# Patient Record
Sex: Female | Born: 1990 | Race: Black or African American | Hispanic: No | Marital: Single | Smoking: Never smoker
Health system: Southern US, Community
[De-identification: ages and names within clinical notes are randomized; demographics above are authoritative.]

## PROBLEM LIST (undated history)

## (undated) DIAGNOSIS — T7840XA Allergy, unspecified, initial encounter: Secondary | ICD-10-CM

## (undated) HISTORY — PX: OTHER SURGICAL HISTORY: SHX169

## (undated) HISTORY — DX: Allergy, unspecified, initial encounter: T78.40XA

---
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
## 2012-05-29 ENCOUNTER — Encounter: Payer: Self-pay | Admitting: Family Medicine

## 2012-05-29 ENCOUNTER — Ambulatory Visit (INDEPENDENT_AMBULATORY_CARE_PROVIDER_SITE_OTHER): Payer: Federal, State, Local not specified - PPO | Admitting: Family Medicine

## 2012-05-29 VITALS — BP 110/70 | HR 96 | Temp 98.3°F | Wt 120.4 lb

## 2012-05-29 DIAGNOSIS — J329 Chronic sinusitis, unspecified: Secondary | ICD-10-CM

## 2012-05-29 MED ORDER — AMOXICILLIN-POT CLAVULANATE 875-125 MG PO TABS: 1.0000 | ORAL_TABLET | Freq: Two times a day (BID) | ORAL | Status: DC

## 2012-05-29 NOTE — Progress Notes (Signed)
  Subjective:     Yolanda Tucker is a 21 y.o. female who presents for evaluation of sinus pain. Symptoms include: congestion, cough, facial pain, headaches, nasal congestion, post nasal drip and sinus pressure. Onset of symptoms was 4 weeks ago. Symptoms have been gradually worsening since that time. Past history is significant for no history of pneumonia or bronchitis. Patient is a non-smoker.  The following portions of the patient's history were reviewed and updated as appropriate: allergies, current medications, past family history, past medical history, past social history, past surgical history and problem list.  Review of Systems Pertinent items are noted in HPI.   Objective:    BP 110/70  Pulse 96  Temp 98.3 F (36.8 C) (Oral)  Wt 120 lb 6.4 oz (54.613 kg)  SpO2 98% General appearance: alert, cooperative, appears stated age and mild distress Nose: green discharge, moderate congestion, turbinates red, swollen, edematous, sinus tenderness bilateral Throat: abnormal findings: mild oropharyngeal erythema Neck: mild anterior cervical adenopathy, supple, symmetrical, trachea midline and thyroid not enlarged, symmetric, no tenderness/mass/nodules Lungs: clear to auscultation bilaterally Heart: S1, S2 normal    Assessment:    Acute bacterial sinusitis.    Plan:    Nasal steroids per medication orders. Antihistamines per medication orders. Augmentin per medication orders. Follow up in a few days or as needed.

## 2012-05-29 NOTE — Patient Instructions (Addendum)

## 2012-06-10 ENCOUNTER — Telehealth: Payer: Self-pay | Admitting: Family Medicine

## 2012-06-10 NOTE — Telephone Encounter (Signed)
Caller: [name]/Patient; Patient Name: Yolanda Tucker; PCP: Lelon Perla.; Best Callback Phone Number: [phone]. Onset 05/29/12 Patient reports she was seen in the office 2 weeks ago and diagnosed sinus infection  and has completed antibiotic and feeling no better.  Afebrile.  Cough, congestion, runny nose clear to yellow in color.  All emergent symptoms ruled out per Upper Respiratory Infection protocol with exception "Symptoms worsen after 7 days or symptoms do not improve after 14 days of home care."  Home care advice given. Per disposition see provider within 24 hours, patient states she will need to call in the AM to schedule appt. due to her school schedule.

## 2012-06-11 NOTE — Telephone Encounter (Signed)
Called patient to schedule appointment. Message on phone indicated patient is not accepting calls at this time. I will try to reach patient again later

## 2012-06-11 NOTE — Telephone Encounter (Signed)
I called patient again and recording indicated patient is not accepting calls at this time.   I called patient on alternate number and left message informing patient to call for appointment, I made her aware on VM that Dr.Lowne is out of the office but she can schedule with one of the other MD's

## 2012-06-12 ENCOUNTER — Ambulatory Visit (INDEPENDENT_AMBULATORY_CARE_PROVIDER_SITE_OTHER): Payer: 59 | Admitting: Family Medicine

## 2012-06-12 ENCOUNTER — Encounter: Payer: Self-pay | Admitting: Family Medicine

## 2012-06-12 VITALS — BP 114/71 | HR 100 | Temp 97.8°F | Ht 65.0 in | Wt 119.2 lb

## 2012-06-12 DIAGNOSIS — T7840XA Allergy, unspecified, initial encounter: Secondary | ICD-10-CM

## 2012-06-12 MED ORDER — MOMETASONE FUROATE 50 MCG/ACT NA SUSP: 2.0000 | Freq: Every day | NASAL | Status: DC

## 2012-06-12 NOTE — Assessment & Plan Note (Signed)
New to provider.  No evidence of current infxn- no need for abx.  Continue daily antihistamine.  Add nasal steroid to decrease congestion and PND.  OTC decongestant short term for sxs relief.  Reviewed supportive care and red flags that should prompt return.  Pt expressed understanding and is in agreement w/ plan.

## 2012-06-12 NOTE — Progress Notes (Signed)
  Subjective:    Patient ID: Yolanda Tucker, female    DOB: Aug 01, 1991, 21 y.o.   MRN: [id number]  HPI ? Sinusitis- pt reports she has had sinus infxn x2 months.  Was tx'd w/ Augmentin after seeing Dr Laury Axon but continues to have congestion, runny nose, dry cough.  Denies facial pain/pressure.  No ear pain.  No fevers.  Taking Zyrtec daily.   Review of Systems For ROS see HPI     Objective:   Physical Exam  Vitals reviewed. Constitutional: She appears well-developed and well-nourished. No distress.  HENT:  Head: Normocephalic and atraumatic.  Right Ear: Tympanic membrane normal.  Left Ear: Tympanic membrane normal.  Nose: Mucosal edema and rhinorrhea present. Right sinus exhibits no maxillary sinus tenderness and no frontal sinus tenderness. Left sinus exhibits no maxillary sinus tenderness and no frontal sinus tenderness.  Mouth/Throat: Mucous membranes are normal. Posterior oropharyngeal erythema (w/ PND) present.  Eyes: Conjunctivae normal and EOM are normal. Pupils are equal, round, and reactive to light.  Neck: Normal range of motion. Neck supple.  Cardiovascular: Normal rate, regular rhythm and normal heart sounds.   Pulmonary/Chest: Effort normal and breath sounds normal. No respiratory distress. She has no wheezes. She has no rales.  Lymphadenopathy:    She has no cervical adenopathy.          Assessment & Plan:

## 2012-06-12 NOTE — Patient Instructions (Addendum)
This is all allergy related Continue the Zyrtec Add the nasal spray- 2 sprays each nostril daily Start Sudafed (available over the counter) for 3-5 days Drink plenty of fluids REST! Hang in there!!!

## 2012-07-15 ENCOUNTER — Encounter: Payer: Self-pay | Admitting: Family Medicine

## 2012-07-15 ENCOUNTER — Ambulatory Visit (INDEPENDENT_AMBULATORY_CARE_PROVIDER_SITE_OTHER): Payer: 59 | Admitting: Family Medicine

## 2012-07-15 VITALS — BP 102/68 | HR 87 | Temp 98.2°F | Wt 123.2 lb

## 2012-07-15 DIAGNOSIS — J329 Chronic sinusitis, unspecified: Secondary | ICD-10-CM

## 2012-07-15 DIAGNOSIS — J40 Bronchitis, not specified as acute or chronic: Secondary | ICD-10-CM

## 2012-07-15 MED ORDER — ALBUTEROL SULFATE (5 MG/ML) 0.5% IN NEBU
2.5000 mg | INHALATION_SOLUTION | Freq: Once | RESPIRATORY_TRACT | Status: AC
  Administered 2012-07-15: 2.5 mg via RESPIRATORY_TRACT

## 2012-07-15 MED ORDER — GUAIFENESIN-CODEINE 100-10 MG/5ML PO SYRP: ORAL_SOLUTION | ORAL | Status: DC

## 2012-07-15 MED ORDER — AMOXICILLIN-POT CLAVULANATE 875-125 MG PO TABS: 1.0000 | ORAL_TABLET | Freq: Two times a day (BID) | ORAL | Status: DC

## 2012-07-15 MED ORDER — PREDNISONE 10 MG PO TABS: ORAL_TABLET | ORAL | Status: DC

## 2012-07-15 NOTE — Progress Notes (Signed)
  Subjective:     Yolanda Tucker is a 21 y.o. female here for evaluation of a cough. Onset of symptoms was 1 week ago. Symptoms have been gradually worsening since that time. The cough is productive and is aggravated by cold air, exercise, infection and reclining position. Associated symptoms include: postnasal drip, shortness of breath, sputum production and wheezing. Patient does not have a history of asthma. Patient does not have a history of environmental allergens. Patient has traveled recently. Patient does not have a history of smoking. Patient has not had a previous chest x-ray. Patient has not had a PPD done.  The following portions of the patient's history were reviewed and updated as appropriate: allergies, current medications, past family history, past medical history, past social history, past surgical history and problem list.  Review of Systems Pertinent items are noted in HPI.    Objective:    Oxygen saturation 97% on room air BP 102/68  Pulse 87  Temp 98.2 F (36.8 C) (Oral)  Wt 123 lb 3.2 oz (55.883 kg)  SpO2 97% General appearance: alert, cooperative, appears stated age and no distress Eyes: conjunctivae/corneas clear. PERRL, EOM's intact. Fundi benign. Ears: normal TM's and external ear canals both ears Nose: green discharge, moderate congestion, turbinates red, swollen, sinus tenderness bilateral Throat: lips, mucosa, and tongue normal; teeth and gums normal Neck: no adenopathy, supple, symmetrical, trachea midline and thyroid not enlarged, symmetric, no tenderness/mass/nodules Back: na Lungs: diminished breath sounds bilaterally Heart: S1, S2 normal Lymph nodes: Cervical, supraclavicular, and axillary nodes normal.    Assessment:    Acute Bronchitis and Sinusitis    Plan:    Antibiotics per medication orders. Antitussives per medication orders. Avoid exposure to tobacco smoke and fumes. Call if shortness of breath worsens, blood in sputum, change in  character of cough, development of fever or chills, inability to maintain nutrition and hydration. Avoid exposure to tobacco smoke and fumes. Follow-up in 2 weeks, or sooner as needed.

## 2012-07-15 NOTE — Patient Instructions (Addendum)

## 2012-12-10 ENCOUNTER — Encounter (HOSPITAL_COMMUNITY): Payer: Self-pay | Admitting: Emergency Medicine

## 2012-12-10 DIAGNOSIS — R51 Headache: Secondary | ICD-10-CM | POA: Insufficient documentation

## 2012-12-10 DIAGNOSIS — Z79899 Other long term (current) drug therapy: Secondary | ICD-10-CM | POA: Insufficient documentation

## 2012-12-10 DIAGNOSIS — R079 Chest pain, unspecified: Secondary | ICD-10-CM | POA: Insufficient documentation

## 2012-12-10 DIAGNOSIS — Z3202 Encounter for pregnancy test, result negative: Secondary | ICD-10-CM | POA: Insufficient documentation

## 2012-12-10 DIAGNOSIS — R42 Dizziness and giddiness: Secondary | ICD-10-CM | POA: Insufficient documentation

## 2012-12-10 LAB — POCT PREGNANCY, URINE: Preg Test, Ur: NEGATIVE

## 2012-12-10 NOTE — ED Notes (Signed)
PT. REPORTS HEADACHE ONSET YESTERDAY AND MID CHEST PAIN WITH DRY COUGH AND DIZZINESS .

## 2012-12-11 ENCOUNTER — Emergency Department (HOSPITAL_COMMUNITY): Payer: Federal, State, Local not specified - PPO

## 2012-12-11 ENCOUNTER — Encounter (HOSPITAL_COMMUNITY): Payer: Self-pay | Admitting: Radiology

## 2012-12-11 ENCOUNTER — Emergency Department (HOSPITAL_COMMUNITY)
Admission: EM | Admit: 2012-12-11 | Discharge: 2012-12-11 | Disposition: A | Discharge status: Home / Self Care | Payer: Federal, State, Local not specified - PPO | Attending: Emergency Medicine | Admitting: Emergency Medicine

## 2012-12-11 DIAGNOSIS — R51 Headache: Secondary | ICD-10-CM

## 2012-12-11 LAB — COMPREHENSIVE METABOLIC PANEL WITH GFR
ALT: 13 U/L (ref 0–35)
AST: 23 U/L (ref 0–37)
Albumin: 3.8 g/dL (ref 3.5–5.2)
Alkaline Phosphatase: 49 U/L (ref 39–117)
BUN: 14 mg/dL (ref 6–23)
CO2: 28 meq/L (ref 19–32)
Calcium: 9.5 mg/dL (ref 8.4–10.5)
Chloride: 105 meq/L (ref 96–112)
Creatinine, Ser: 0.63 mg/dL (ref 0.50–1.10)
GFR calc Af Amer: >90 mL/min (ref >90)
GFR calc non Af Amer: >90 mL/min (ref >90)
Glucose, Bld: 94 mg/dL (ref 70–99)
Potassium: 3.5 meq/L (ref 3.5–5.1)
Sodium: 140 meq/L (ref 135–145)
Total Bilirubin: 0.3 mg/dL (ref 0.3–1.2)
Total Protein: 7.5 g/dL (ref 6.0–8.3)

## 2012-12-11 LAB — CBC WITH DIFFERENTIAL/PLATELET
Basophils Relative: 0 % (ref 0–1)
Eosinophils Absolute: 0.3 10*3/uL (ref 0.0–0.7)
Hemoglobin: 10.6 g/dL — ABNORMAL LOW (ref 12.0–15.0)
Lymphs Abs: 2 10*3/uL (ref 0.7–4.0)
MCH: 23.5 pg — ABNORMAL LOW (ref 26.0–34.0)
MCHC: 33.9 g/dL (ref 30.0–36.0)
Monocytes Absolute: 0.5 10*3/uL (ref 0.1–1.0)
Neutro Abs: 2.1 10*3/uL (ref 1.7–7.7)
Neutrophils Relative %: 43 % (ref 43–77)

## 2012-12-11 MED ORDER — HYDROCODONE-ACETAMINOPHEN 5-325 MG PO TABS: 1.0000 | ORAL_TABLET | ORAL | Status: DC | PRN

## 2012-12-11 MED ORDER — SODIUM CHLORIDE 0.9 % IV BOLUS (SEPSIS)
1000.0000 mL | Freq: Once | INTRAVENOUS | Status: AC
  Administered 2012-12-11: 1000 mL via INTRAVENOUS

## 2012-12-11 MED ORDER — IBUPROFEN 600 MG PO TABS: 600.0000 mg | ORAL_TABLET | Freq: Three times a day (TID) | ORAL | Status: AC | PRN

## 2012-12-11 MED ORDER — MORPHINE SULFATE 4 MG/ML IJ SOLN
4.0000 mg | Freq: Once | INTRAMUSCULAR | Status: AC
  Administered 2012-12-11: 4 mg via INTRAVENOUS
  Filled 2012-12-11: qty 1

## 2012-12-11 MED ORDER — METOCLOPRAMIDE HCL 5 MG/ML IJ SOLN
10.0000 mg | Freq: Once | INTRAMUSCULAR | Status: AC
  Administered 2012-12-11: 10 mg via INTRAVENOUS
  Filled 2012-12-11: qty 2

## 2012-12-11 NOTE — ED Notes (Signed)
Pt reports that she is currently taking an antibiotic and Prednisone for recent URI.

## 2012-12-11 NOTE — ED Provider Notes (Signed)
History     CSN: [id number]  Arrival date & time 12/10/12  2325   First MD Initiated Contact with Patient 12/11/12 0011      Chief Complaint  Patient presents with  . Headache  . Chest Pain    The history is provided by the patient.   patient reports worsening headache since yesterday.  She was at a meeting when her headache began and seems like a persistently has been getting worse.  No nausea or vomiting.  No frank photophobia.  She's never had headaches like this before.  She's tried over-the-counter pain medications without improvement in her symptoms.  No fevers or chills.  No recent trauma to her head.  No neck pain or neck stiffness.  She does report some mid chest pain with cough as well as some associated "dizziness".  No weakness of her upper lower extremities.  She ambulated in the emergency department.  She reports occasional episodes of sharp stabbing pain that makes her have bilateral blurred vision.  She reports she's currently on an antibiotic for a recent sinus infection.  She's also taking prednisone.  Past Medical History  Diagnosis Date  . Allergy     History reviewed. No pertinent past surgical history.  Family History  Problem Relation Age of Onset  . Diabetes      History  Substance Use Topics  . Smoking status: Never Smoker   . Smokeless tobacco: Never Used  . Alcohol Use: No    OB History   Grav Para Term Preterm Abortions TAB SAB Ect Mult Living                  Review of Systems  Cardiovascular: Positive for chest pain.  Neurological: Positive for headaches.  All other systems reviewed and are negative.    Allergies  Review of patient's allergies indicates no known allergies.  Home Medications   Current Outpatient Rx  Name  Route  Sig  Dispense  Refill  . cefUROXime (CEFTIN) 500 MG tablet   Oral   Take 500 mg by mouth 2 (two) times daily.         Marland Kitchen ibuprofen (ADVIL,MOTRIN) 200 MG tablet   Oral   Take 200 mg by mouth every 6  (six) hours as needed for pain.         Marland Kitchen levocetirizine (XYZAL) 5 MG tablet   Oral   Take 5 mg by mouth every evening.         Marland Kitchen HYDROcodone-acetaminophen (NORCO/VICODIN) 5-325 MG per tablet   Oral   Take 1 tablet by mouth every 4 (four) hours as needed for pain.   8 tablet   0   . ibuprofen (ADVIL,MOTRIN) 600 MG tablet   Oral   Take 1 tablet (600 mg total) by mouth every 8 (eight) hours as needed for pain.   15 tablet   0     BP 134/79  Pulse 81  Temp(Src) 98.8 F (37.1 C) (Oral)  Resp 17  SpO2 99%  LMP 12/11/2012  Physical Exam  Nursing note and vitals reviewed. Constitutional: She is oriented to person, place, and time. She appears well-developed and well-nourished. No distress.  HENT:  Head: Normocephalic and atraumatic.  Eyes: EOM are normal. Pupils are equal, round, and reactive to light.  Neck: Normal range of motion.  Cardiovascular: Normal rate, regular rhythm and normal heart sounds.   Pulmonary/Chest: Effort normal and breath sounds normal.  Abdominal: Soft. She exhibits no distension. There is  no tenderness.  Musculoskeletal: Normal range of motion.  Neurological: She is alert and oriented to person, place, and time.  5/5 strength in major muscle groups of  bilateral upper and lower extremities. Speech normal. No facial asymetry.   Skin: Skin is warm and dry.  Psychiatric: She has a normal mood and affect. Judgment normal.    ED Course  Procedures (including critical care time)   Date: 12/11/2012  Rate: 89  Rhythm: normal sinus rhythm  QRS Axis: normal  Intervals: normal  ST/T Wave abnormalities: normal  Conduction Disutrbances: none  Narrative Interpretation:   Old EKG Reviewed: No prior EKG available     Labs Reviewed  CBC WITH DIFFERENTIAL - Abnormal; Notable for the following:    Hemoglobin 10.6 (*)    HCT 31.3 (*)    MCV 69.4 (*)    MCH 23.5 (*)    Eosinophils Relative 6 (*)    All other components within normal limits   COMPREHENSIVE METABOLIC PANEL  POCT PREGNANCY, URINE   Dg Chest 2 View  12/11/2012  *RADIOLOGY REPORT*  Clinical Data: Mid chest pain and headache.  CHEST - 2 VIEW  Comparison: None.  Findings: The lungs are well-aerated and clear.  There is no evidence of focal opacification, pleural effusion or pneumothorax.  The heart is normal in size; the mediastinal contour is within normal limits.  No acute osseous abnormalities are seen.  IMPRESSION: No acute cardiopulmonary process seen.   Original Report Authenticated By: Tonia Ghent, M.D.    Ct Head Wo Contrast  12/11/2012  *RADIOLOGY REPORT*  Clinical Data: Headache for 2 days; blurred vision and dizziness.  CT HEAD WITHOUT CONTRAST  Technique:  Contiguous axial images were obtained from the base of the skull through the vertex without contrast.  Comparison: None.  Findings: There is no evidence of acute infarction, mass lesion, or intra- or extra-axial hemorrhage on CT.  The posterior fossa, including the cerebellum, brainstem and fourth ventricle, is within normal limits.  The third and lateral ventricles, and basal ganglia are unremarkable in appearance.  The cerebral hemispheres are symmetric in appearance, with normal gray- white differentiation.  No mass effect or midline shift is seen.  There is no evidence of fracture; visualized osseous structures are unremarkable in appearance.  The visualized portions of the orbits are within normal limits.  The paranasal sinuses and mastoid air cells are well-aerated.  No significant soft tissue abnormalities are seen.  IMPRESSION: Unremarkable noncontrast CT of the head.   Original Report Authenticated By: Tonia Ghent, M.D.    I personally reviewed the imaging tests through PACS system I reviewed available ER/hospitalization records through the EMR   1. Headache       MDM  2:12 AM Patient feels much better at this time.  Normal neurologic exam.  CT head is normal and demonstrates no evidence of  intracranial mass.  My suspicion for subarachnoid hemorrhage is very low.  Discharge home with PCP and neurology followup.  She understands return to the emergency department for new or worsening symptoms        Lyanne Co, MD 12/11/12 [phone]

## 2013-10-01 ENCOUNTER — Encounter (HOSPITAL_COMMUNITY): Payer: Self-pay | Admitting: Emergency Medicine

## 2013-10-01 ENCOUNTER — Emergency Department (HOSPITAL_COMMUNITY): Payer: Federal, State, Local not specified - PPO

## 2013-10-01 ENCOUNTER — Emergency Department (HOSPITAL_COMMUNITY)
Admission: EM | Admit: 2013-10-01 | Discharge: 2013-10-02 | Disposition: A | Discharge status: Home / Self Care | Payer: Federal, State, Local not specified - PPO | Attending: Emergency Medicine | Admitting: Emergency Medicine

## 2013-10-01 DIAGNOSIS — Z3202 Encounter for pregnancy test, result negative: Secondary | ICD-10-CM | POA: Insufficient documentation

## 2013-10-01 DIAGNOSIS — R109 Unspecified abdominal pain: Secondary | ICD-10-CM

## 2013-10-01 DIAGNOSIS — N83202 Unspecified ovarian cyst, left side: Secondary | ICD-10-CM

## 2013-10-01 DIAGNOSIS — N83209 Unspecified ovarian cyst, unspecified side: Secondary | ICD-10-CM | POA: Insufficient documentation

## 2013-10-01 DIAGNOSIS — Z79899 Other long term (current) drug therapy: Secondary | ICD-10-CM | POA: Insufficient documentation

## 2013-10-01 LAB — BASIC METABOLIC PANEL
BUN: 14 mg/dL (ref 6–23)
CHLORIDE: 99 meq/L (ref 96–112)
CO2: 23 mEq/L (ref 19–32)
CREATININE: 0.72 mg/dL (ref 0.50–1.10)
Calcium: 8.9 mg/dL (ref 8.4–10.5)
GFR calc non Af Amer: >90 mL/min (ref >90)
Glucose, Bld: 134 mg/dL — ABNORMAL HIGH (ref 70–99)
Potassium: 3.4 mEq/L — ABNORMAL LOW (ref 3.7–5.3)
Sodium: 136 mEq/L — ABNORMAL LOW (ref 137–147)

## 2013-10-01 LAB — CBC WITH DIFFERENTIAL/PLATELET
BASOS ABS: 0 10*3/uL (ref 0.0–0.1)
Basophils Relative: 0 % (ref 0–1)
EOS ABS: 0.2 10*3/uL (ref 0.0–0.7)
Eosinophils Relative: 2 % (ref 0–5)
HEMATOCRIT: 28.6 % — AB (ref 36.0–46.0)
Hemoglobin: 9.1 g/dL — ABNORMAL LOW (ref 12.0–15.0)
LYMPHS ABS: 1.6 10*3/uL (ref 0.7–4.0)
Lymphocytes Relative: 21 % (ref 12–46)
MCH: 20.9 pg — AB (ref 26.0–34.0)
MCHC: 31.8 g/dL (ref 30.0–36.0)
MCV: 65.6 fL — AB (ref 78.0–100.0)
MONOS PCT: 4 % (ref 3–12)
Monocytes Absolute: 0.3 10*3/uL (ref 0.1–1.0)
NEUTROS ABS: 5.6 10*3/uL (ref 1.7–7.7)
Neutrophils Relative %: 73 % (ref 43–77)
Platelets: 261 10*3/uL (ref 150–400)
RBC: 4.36 MIL/uL (ref 3.87–5.11)
RDW: 17.7 % — AB (ref 11.5–15.5)
WBC: 7.7 10*3/uL (ref 4.0–10.5)

## 2013-10-01 LAB — WET PREP, GENITAL
Clue Cells Wet Prep HPF POC: NONE SEEN
TRICH WET PREP: NONE SEEN
Yeast Wet Prep HPF POC: NONE SEEN

## 2013-10-01 LAB — URINALYSIS W MICROSCOPIC + REFLEX CULTURE
Bilirubin Urine: NEGATIVE
GLUCOSE, UA: NEGATIVE mg/dL
Hgb urine dipstick: NEGATIVE
Ketones, ur: 40 mg/dL — AB
NITRITE: NEGATIVE
PROTEIN: NEGATIVE mg/dL
Specific Gravity, Urine: 1.042 — ABNORMAL HIGH (ref 1.005–1.030)
UROBILINOGEN UA: 0.2 mg/dL (ref 0.0–1.0)
pH: 5.5 (ref 5.0–8.0)

## 2013-10-01 MED ORDER — IOHEXOL 300 MG/ML  SOLN
100.0000 mL | Freq: Once | INTRAMUSCULAR | Status: AC | PRN
  Administered 2013-10-01: 100 mL via INTRAVENOUS

## 2013-10-01 MED ORDER — IOHEXOL 300 MG/ML  SOLN
50.0000 mL | Freq: Once | INTRAMUSCULAR | Status: AC | PRN
  Administered 2013-10-01: 50 mL via ORAL

## 2013-10-01 NOTE — ED Notes (Signed)
Pt states around 4pm she started having abd cramping but it is not time for her cycle  Pt states the pain is on her right side and moves up and down from her chest to her vagina  Denies N/V/D  Pt states her last BM was on Monday

## 2013-10-02 ENCOUNTER — Emergency Department (HOSPITAL_COMMUNITY): Payer: Federal, State, Local not specified - PPO

## 2013-10-02 LAB — URINE CULTURE: Colony Count: 5000

## 2013-10-02 LAB — POCT PREGNANCY, URINE
Preg Test, Ur: NEGATIVE
Preg Test, Ur: NEGATIVE

## 2013-10-02 LAB — GC/CHLAMYDIA PROBE AMP
CT Probe RNA: NEGATIVE
GC Probe RNA: NEGATIVE

## 2013-10-02 MED ORDER — LIDOCAINE HCL 1 % IJ SOLN
INTRAMUSCULAR | Status: AC
  Administered 2013-10-02: 20 mL
  Filled 2013-10-02: qty 20

## 2013-10-02 MED ORDER — MELOXICAM 15 MG PO TABS: 15.0000 mg | ORAL_TABLET | Freq: Every day | ORAL | Status: AC

## 2013-10-02 MED ORDER — CEFTRIAXONE SODIUM 250 MG IJ SOLR
250.0000 mg | Freq: Once | INTRAMUSCULAR | Status: AC
Start: 2013-10-02 — End: 2013-10-02
  Administered 2013-10-02: 250 mg via INTRAMUSCULAR
  Filled 2013-10-02: qty 250

## 2013-10-02 NOTE — ED Provider Notes (Signed)
CSN: [id number]     Arrival date & time 10/01/13  1959 History   First MD Initiated Contact with Patient 10/01/13 2150     Chief Complaint  Patient presents with  . Abdominal Pain   (Consider location/radiation/quality/duration/timing/severity/associated sxs/prior Treatment) HPI Yolanda Tucker is a 23 y.o. female who presents to emergency department complaining of right lower quadrant abdominal pain onset this afternoon. Patient states that she went to urgent care and they told her to come to emergency department he chooses not her appendix. Patient denies abnormal vaginal discharge or bleeding. She states her next periods in 3 weeks. She denies any urinary symptoms. She denies any fever. She denies any changes in bowels. She denies any nausea vomiting. She denies fever. She did not take any medications prior to coming in. She denies any prior abdominal surgeries. She denies any possible pregnancy. She states she has not had intercourse in one month. She states she has been using tampons and she normally does not use them.  Past Medical History  Diagnosis Date  . Allergy    Past Surgical History  Procedure Laterality Date  . Extraction of wisdom teeth     Family History  Problem Relation Age of Onset  . Diabetes     History  Substance Use Topics  . Smoking status: Never Smoker   . Smokeless tobacco: Never Used  . Alcohol Use: No     Comment: social   OB History   Grav Para Term Preterm Abortions TAB SAB Ect Mult Living                 Review of Systems  Constitutional: Negative for fever and chills.  Respiratory: Negative for cough, chest tightness and shortness of breath.   Cardiovascular: Negative for chest pain, palpitations and leg swelling.  Gastrointestinal: Positive for abdominal pain. Negative for nausea, vomiting and diarrhea.  Genitourinary: Negative for dysuria, flank pain, vaginal bleeding, vaginal discharge, vaginal pain and pelvic pain.  Musculoskeletal:  Negative for arthralgias, myalgias, neck pain and neck stiffness.  Skin: Negative for rash.  Neurological: Negative for dizziness, weakness and headaches.  All other systems reviewed and are negative.    Allergies  Review of patient's allergies indicates no known allergies.  Home Medications   Current Outpatient Rx  Name  Route  Sig  Dispense  Refill  . ibuprofen (ADVIL,MOTRIN) 200 MG tablet   Oral   Take 400 mg by mouth every 6 (six) hours as needed for pain.          Marland Kitchen levocetirizine (XYZAL) 5 MG tablet   Oral   Take 5 mg by mouth every evening.         . Norethindrone-Ethinyl Estradiol-Fe Biphas (LO LOESTRIN FE) 1 MG-10 MCG / 10 MCG tablet   Oral   Take 1 tablet by mouth daily.         Marland Kitchen ibuprofen (ADVIL,MOTRIN) 600 MG tablet   Oral   Take 1 tablet (600 mg total) by mouth every 8 (eight) hours as needed for pain.   15 tablet   0    BP 127/80  Pulse 100  Temp(Src) 97.5 F (36.4 C) (Oral)  Resp 14  SpO2 100%  LMP 09/17/2013 Physical Exam  Nursing note and vitals reviewed. Constitutional: She is oriented to person, place, and time. She appears well-developed and well-nourished. No distress.  HENT:  Head: Normocephalic.  Eyes: Conjunctivae are normal.  Neck: Neck supple.  Cardiovascular: Normal rate, regular rhythm and normal heart  sounds.   Pulmonary/Chest: Effort normal and breath sounds normal. No respiratory distress. She has no wheezes. She has no rales.  Abdominal: Soft. Bowel sounds are normal. She exhibits no distension. There is tenderness. There is no rebound.  Genitourinary:  Normal vaginal canal. Cervix appears normal. THin white vaginal discharge. No CMT. Uterine tenderness is present. No adnexal tenderness  Musculoskeletal: She exhibits no edema.  Neurological: She is alert and oriented to person, place, and time.  Skin: Skin is warm and dry.  Psychiatric: She has a normal mood and affect. Her behavior is normal.    ED Course  Procedures  (including critical care time) Labs Review Labs Reviewed  WET PREP, GENITAL - Abnormal; Notable for the following:    WBC, Wet Prep HPF POC TOO NUMEROUS TO COUNT (*)    All other components within normal limits  CBC WITH DIFFERENTIAL - Abnormal; Notable for the following:    Hemoglobin 9.1 (*)    HCT 28.6 (*)    MCV 65.6 (*)    MCH 20.9 (*)    RDW 17.7 (*)    All other components within normal limits  BASIC METABOLIC PANEL - Abnormal; Notable for the following:    Sodium 136 (*)    Potassium 3.4 (*)    Glucose, Bld 134 (*)    All other components within normal limits  URINALYSIS W MICROSCOPIC + REFLEX CULTURE - Abnormal; Notable for the following:    APPearance CLOUDY (*)    Specific Gravity, Urine 1.042 (*)    Ketones, ur 40 (*)    Leukocytes, UA SMALL (*)    Bacteria, UA FEW (*)    Squamous Epithelial / LPF FEW (*)    All other components within normal limits  GC/CHLAMYDIA PROBE AMP  URINE CULTURE  POCT PREGNANCY, URINE   Imaging Review Ct Abdomen Pelvis W Contrast  10/02/2013   CLINICAL DATA:  Acute onset right lower quadrant pain this morning.  EXAM: CT ABDOMEN AND PELVIS WITH CONTRAST  TECHNIQUE: Multidetector CT imaging of the abdomen and pelvis was performed using the standard protocol following bolus administration of intravenous contrast.  CONTRAST:  50 mL OMNIPAQUE IOHEXOL 300 MG/ML SOLN, 100 mL OMNIPAQUE IOHEXOL 300 MG/ML SOLN  COMPARISON:  None.  FINDINGS: Lung bases are clear.  No pleural or pericardial effusion.  The liver, adrenal glands, spleen, pancreas and kidneys appear normal. The appendix is visualized and normal in appearance. There is a small volume of free pelvic fluid greater than seen in physiologic change. A large fluid-filled structure in the left adnexa measures 7.3 cm AP x 4.5 cm transverse x 5.7 cm craniocaudal. Fluid within the structure demonstrates Hounsfield unit measurements of 30.6. The uterus appears normal. No lymphadenopathy is identified. There  is no focal bony abnormality.  IMPRESSION: Large fluid-filled structure in the left adnexa could be a hemorrhagic cyst or tubo-ovarian abscess. Free fluid in the pelvis is greater than typically seen with physiologic change and could be due to rupture of a hemorrhagic cyst. Correlation with pelvic exam is recommended.  Negative for appendicitis.   Electronically Signed   By: Inge Rise M.D.   On: 10/02/2013 00:30    EKG Interpretation   None       MDM  No diagnosis found.  Patient to a right lower quadrant tenderness. She is tender over both Right and left and lower quadrants but when I ask her where the pain is points to the right side. She is tender over McBurney's  point. Her vaginal exam is unremarkable other than tenderness over the uterus. A urine pregnancy and UA are normal. Her white count is normal however given the amount of tenderness CT scan was obtained.  12:50 AM CT showing left adnexal mass and free pelvic fluid. Pt does not want pain medications. Discussed results with pt. Will treat with rocephin IM. Will add US pelvis. Signed out to PA Dammen at shift change.     Renold Genta, PA-C 10/04/13 [phone]

## 2013-10-02 NOTE — Discharge Instructions (Signed)
You were seen and evaluated for your lower abdominal pain and discomfort. Your CAT scan and ultrasound studies today have shown that you have a large complicated left ovarian cyst. It is recommended that you have reevaluation of this in the next 6-12 weeks. Please followup with your primary care provider or OB/GYN specialist. Your testing did not show any other concerning or emergent causes of your pain. You had a normal appendix on the CAT scan. Return at any time to the emergency department for changing or worsening symptoms.     Abdominal Pain, Adult Many things can cause abdominal pain. Usually, abdominal pain is not caused by a disease and will improve without treatment. It can often be observed and treated at home. Your health care provider will do a physical exam and possibly order blood tests and X-rays to help determine the seriousness of your pain. However, in many cases, more time must pass before a clear cause of the pain can be found. Before that point, your health care provider may not know if you need more testing or further treatment. HOME CARE INSTRUCTIONS  Monitor your abdominal pain for any changes. The following actions may help to alleviate any discomfort you are experiencing:  Only take over-the-counter or prescription medicines as directed by your health care provider.  Do not take laxatives unless directed to do so by your health care provider.  Try a clear liquid diet (broth, tea, or water) as directed by your health care provider. Slowly move to a bland diet as tolerated. SEEK MEDICAL CARE IF:  You have unexplained abdominal pain.  You have abdominal pain associated with nausea or diarrhea.  You have pain when you urinate or have a bowel movement.  You experience abdominal pain that wakes you in the night.  You have abdominal pain that is worsened or improved by eating food.  You have abdominal pain that is worsened with eating fatty foods. SEEK IMMEDIATE MEDICAL  CARE IF:   Your pain does not go away within 2 hours.  You have a fever.  You keep throwing up (vomiting).  Your pain is felt only in portions of the abdomen, such as the right side or the left lower portion of the abdomen.  You pass bloody or black tarry stools. MAKE SURE YOU:  Understand these instructions.   Will watch your condition.   Will get help right away if you are not doing well or get worse.  Document Released: 05/24/2005 Document Revised: 06/04/2013 Document Reviewed: 04/23/2013 [facility] Patient Information 2014 [place].    Ovarian Cyst An ovarian cyst is a sac filled with fluid or blood. This sac is attached to the ovary. Some cysts go away on their own. Other cysts need treatment.  HOME CARE   Only take medicine as told by your doctor.  Follow up with your doctor as told.  Get regular pelvic exams and Pap tests. GET HELP IF:  Your periods are late, not regular, or painful.  You stop having periods.  Your belly (abdominal) or pelvic pain does not go away.  Your belly becomes large or puffy (swollen).  You have a hard time peeing (totally emptying your bladder).  You have pressure on your bladder.  You have pain during sex.  You feel fullness, pressure, or discomfort in your belly.  You lose weight for no reason.  You feel sick most of the time.  You have a hard time pooping (constipation).  You do not feel like eating.  You develop pimples (acne).  You have an increase in hair on your body and face.  You are gaining weight for no reason.  You think you are pregnant. GET HELP RIGHT AWAY IF:   Your belly pain gets worse.  You feel sick to your stomach (nauseous), and you throw up (vomit).  You have a fever that comes on fast.  You have belly pain while pooping (bowel movement).  Your periods are heavier than usual. MAKE SURE YOU:   Understand these instructions.  Will watch your condition.  Will get help right  away if you are not doing well or get worse. Document Released: 01/31/2008 Document Revised: 06/04/2013 Document Reviewed: 04/21/2013 [facility] Patient Information 2014 [place].

## 2013-10-02 NOTE — ED Provider Notes (Signed)
Yolanda Tucker S 1:00 AM patient discussed in signout. Patient pending pelvic ultrasounds to evaluate left adnexal mass.  Ultrasound demonstrating complex cystic structure in the left ovary area. Recommended followup in 6-12 weeks. No signs of torsion. No signs of TOA. At this time patient may be discharged home to follow up with PCP or OB/GYN.  Martie Lee, PA-C 10/02/13 [phone]

## 2013-10-07 ENCOUNTER — Telehealth (HOSPITAL_BASED_OUTPATIENT_CLINIC_OR_DEPARTMENT_OTHER): Payer: Self-pay

## 2013-10-11 NOTE — ED Provider Notes (Signed)
Medical screening examination/treatment/procedure(s) were performed by non-physician practitioner and as supervising physician I was immediately available for consultation/collaboration.  EKG Interpretation   None        Varney Biles, MD 10/11/13 [phone]

## 2013-10-24 NOTE — ED Provider Notes (Signed)
Medical screening examination/treatment/procedure(s) were performed by non-physician practitioner and as supervising physician I was immediately available for consultation/collaboration.  EKG Interpretation  None   Merryl Hacker, MD 10/24/13 1223

## 2015-09-23 ENCOUNTER — Other Ambulatory Visit: Payer: Self-pay | Admitting: Obstetrics and Gynecology

## 2015-09-23 DIAGNOSIS — N6322 Unspecified lump in the left breast, upper inner quadrant: Secondary | ICD-10-CM

## 2015-09-27 ENCOUNTER — Ambulatory Visit
Admission: RE | Admit: 2015-09-27 | Discharge: 2015-09-27 | Disposition: A | Discharge status: Home / Self Care | Payer: 59 | Source: Ambulatory Visit | Attending: Obstetrics and Gynecology | Admitting: Obstetrics and Gynecology

## 2015-09-27 DIAGNOSIS — N6322 Unspecified lump in the left breast, upper inner quadrant: Secondary | ICD-10-CM

## 2015-12-14 IMAGING — CT CT ABD-PELV W/ CM
1 of 2 series · 15 of 32 positions shown, 19 images · IV contrast (100 ML OMNI 300)
Comparison: None.

CLINICAL DATA: Acute onset right lower quadrant pain this morning.

EXAM:
CT ABDOMEN AND PELVIS WITH CONTRAST
TECHNIQUE: Multidetector CT imaging of the abdomen and pelvis was performed
using the standard protocol following bolus administration of
intravenous contrast.
CONTRAST:  50 mL OMNIPAQUE IOHEXOL 300 MG/ML SOLN, 100 mL OMNIPAQUE
IOHEXOL 300 MG/ML SOLN

[Series 2: abd/pel with · axial · 0.52mm/px · z∈[+1392,+1732]mm · 15 of 77 slices shown, 19 images]
[im 6/77  soft-tissue]
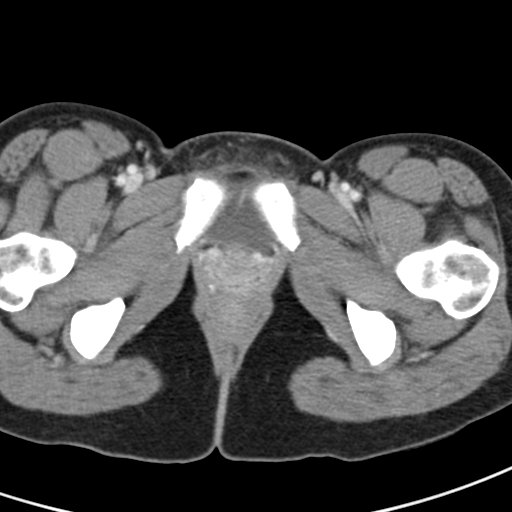
[im 6/77  bone]
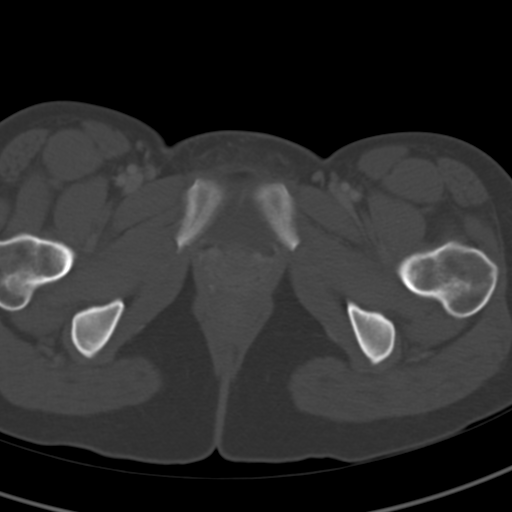
[im 11/77  soft-tissue]
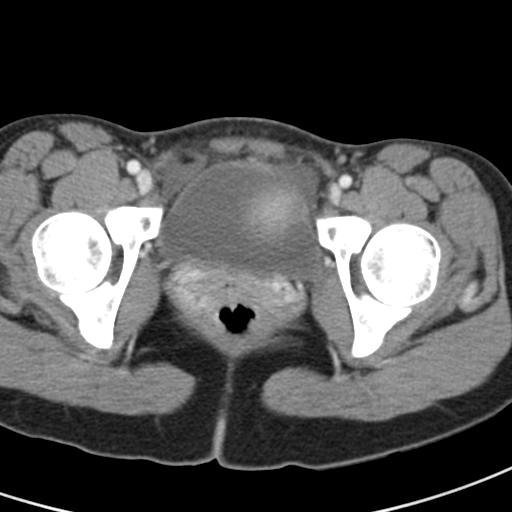
[im 17/77  soft-tissue]
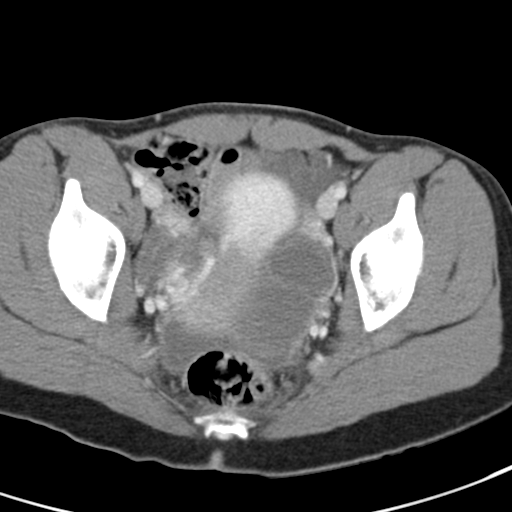
[im 22/77  soft-tissue]
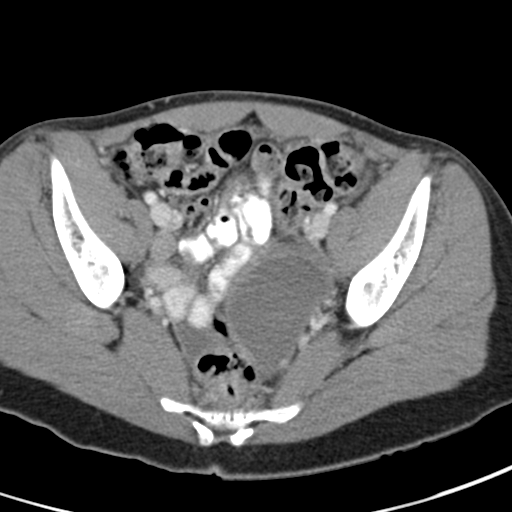
[im 28/77  soft-tissue]
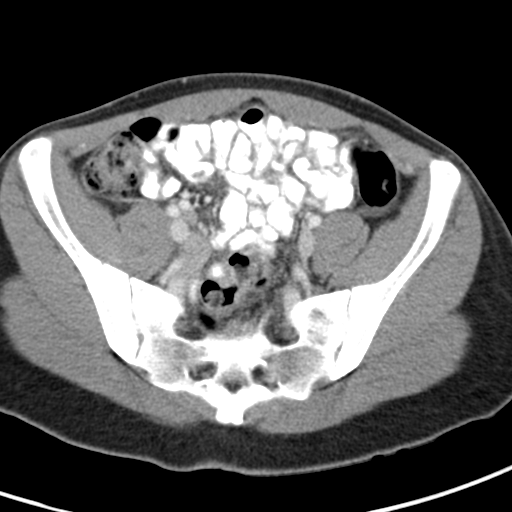
[im 33/77  soft-tissue]
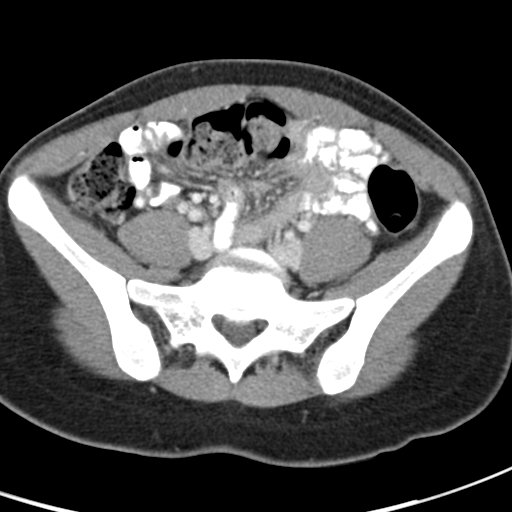
[im 39/77  soft-tissue]
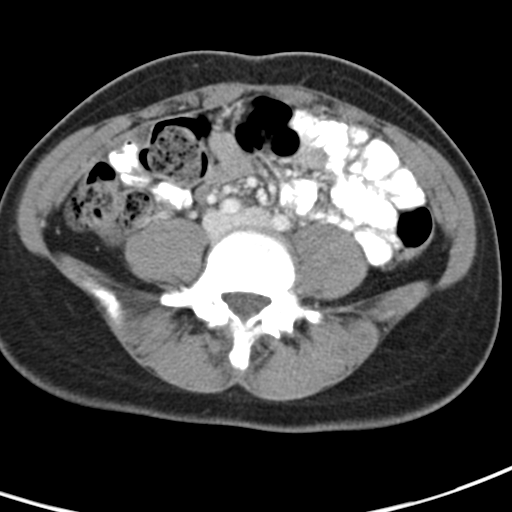
[im 44/77  soft-tissue]
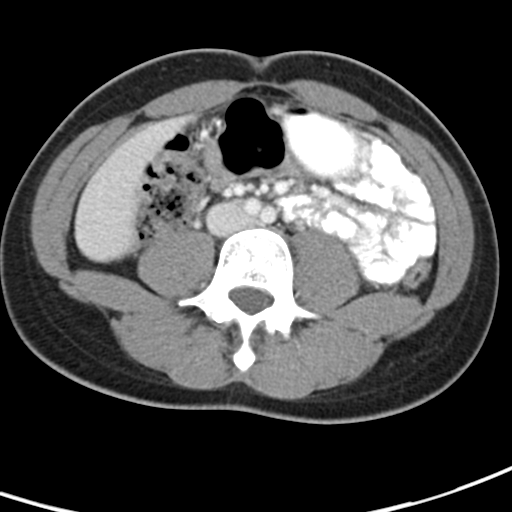
[im 49/77  soft-tissue]
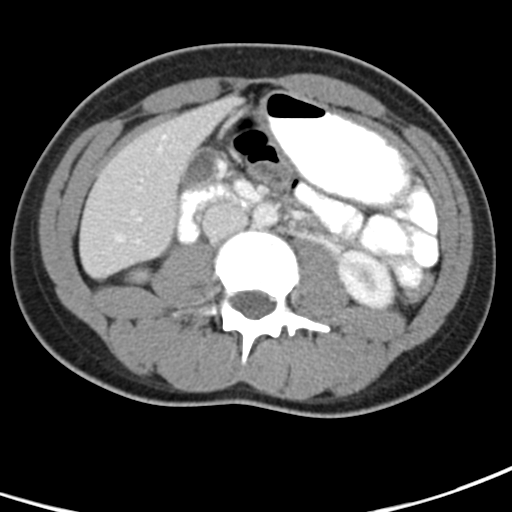
[im 49/77  bone]
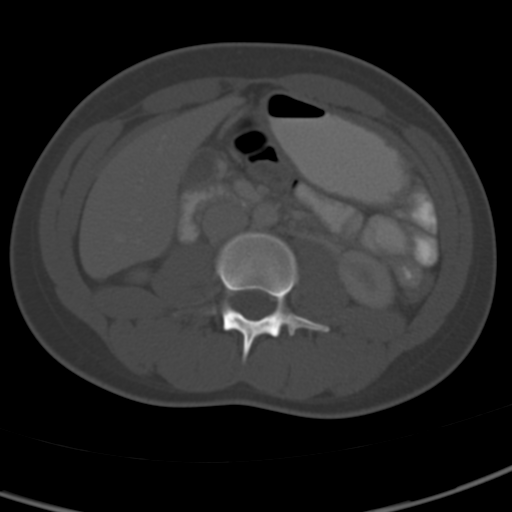
[im 55/77  soft-tissue]
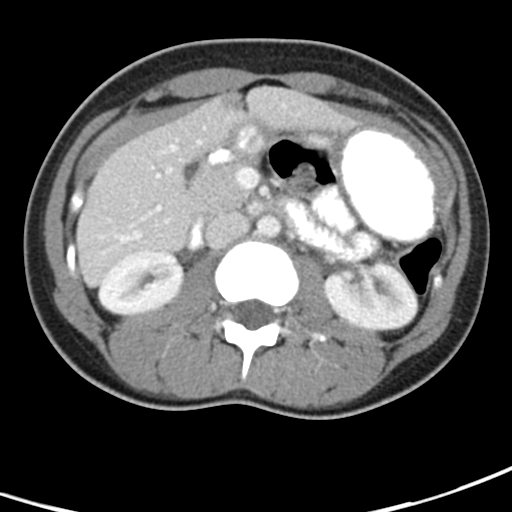
[im 60/77  soft-tissue]
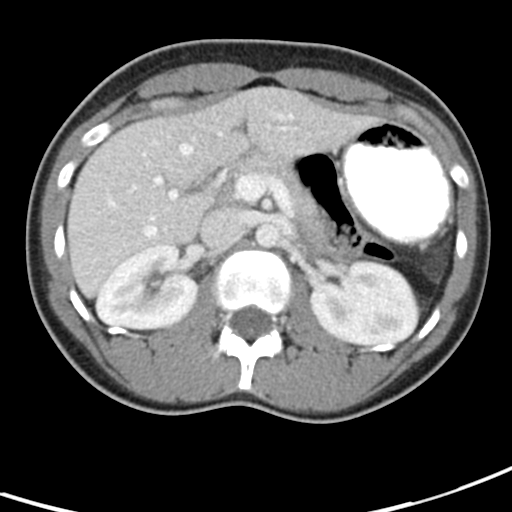
[im 66/77  soft-tissue]
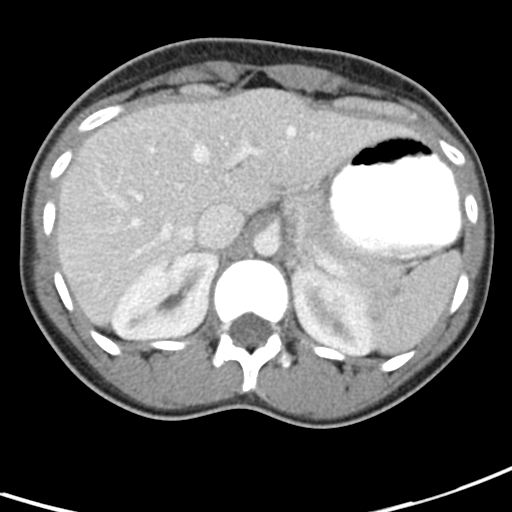
[im 66/77  lung]
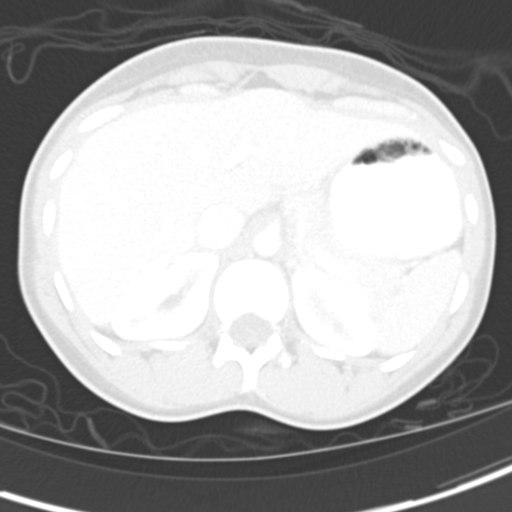
[im 68/77  lung]
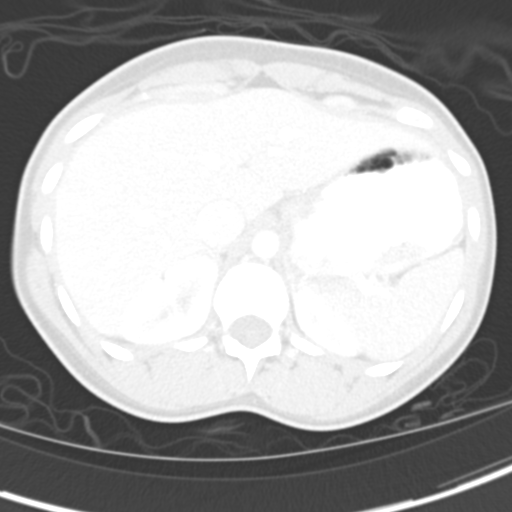
[im 71/77  soft-tissue]
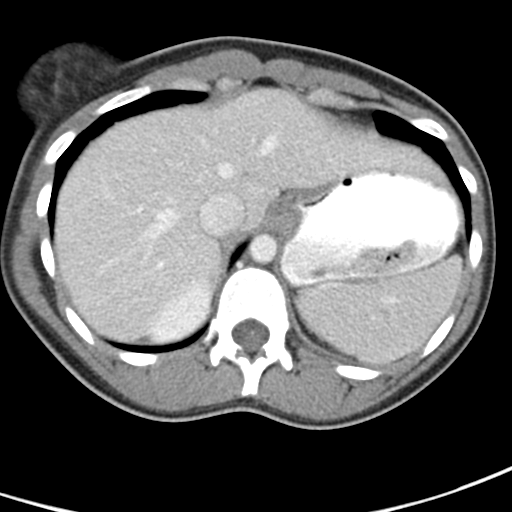
[im 71/77  lung]
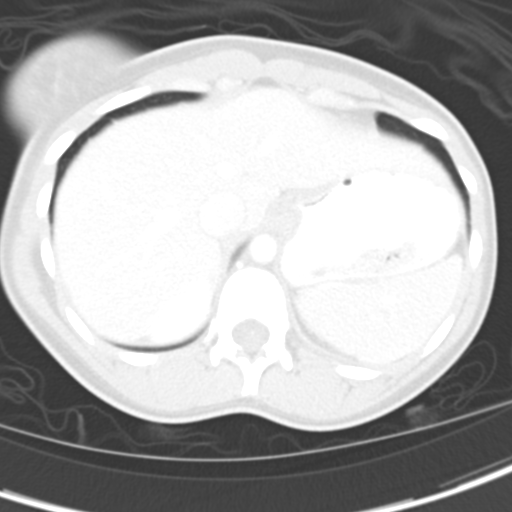
[im 74/77  lung]
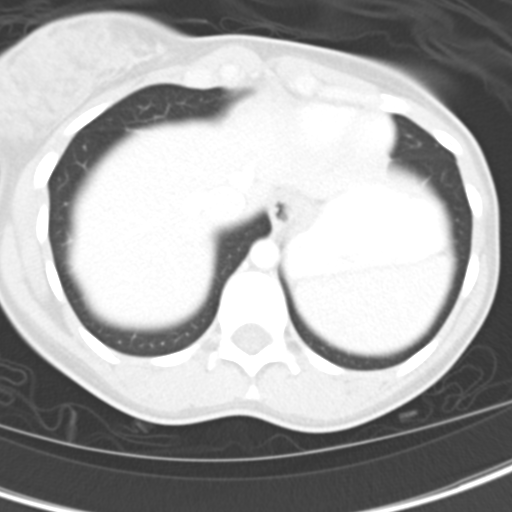

[15 of 32 positions shown; findings below may reference images not displayed]

FINDINGS: Lung bases are clear.  No pleural or pericardial effusion.

The liver, adrenal glands, spleen, pancreas and kidneys appear
normal. The appendix is visualized and normal in appearance. There
is a small volume of free pelvic fluid greater than seen in
physiologic change. A large fluid-filled structure in the left
adnexa measures 7.3 cm AP x 4.5 cm transverse x 5.7 cm craniocaudal.
Fluid within the structure demonstrates Hounsfield unit measurements
of 30.6. The uterus appears normal. No lymphadenopathy is
identified. There is no focal bony abnormality.
IMPRESSION: Large fluid-filled structure in the left adnexa could be a
hemorrhagic cyst or tubo-ovarian abscess. Free fluid in the pelvis
is greater than typically seen with physiologic change and could be
due to rupture of a hemorrhagic cyst. Correlation with pelvic exam
is recommended.

Negative for appendicitis.

## 2015-12-15 IMAGING — US US PELVIS COMPLETE
1 series · 13 of 25 positions shown · non-contrast
Comparison: None.

CLINICAL DATA: Abdominal pain.

EXAM:
TRANSABDOMINAL ULTRASOUND OF PELVIS
DOPPLER ULTRASOUND OF OVARIES
TECHNIQUE: Transabdominal ultrasound examination of the pelvis was performed
including evaluation of the uterus, ovaries, adnexal regions, and
pelvic cul-de-sac.
Color and duplex Doppler ultrasound was utilized to evaluate blood
flow to the ovaries.

[Series 1: us pelvis complete · 0.17mm/px · 13 of 50 slices shown]
[im 1/50]
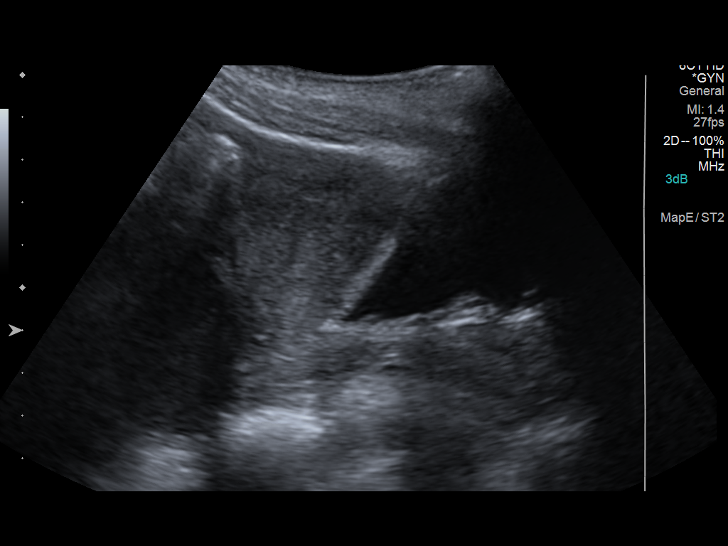
[im 5/50]
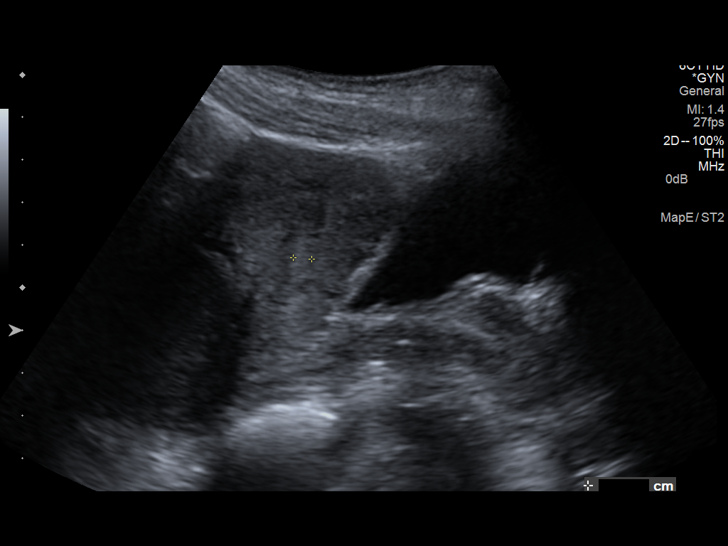
[im 9/50]
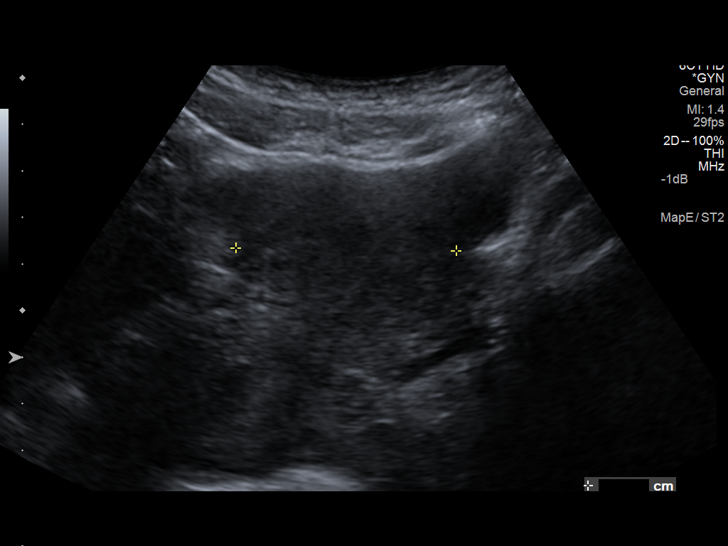
[im 13/50]
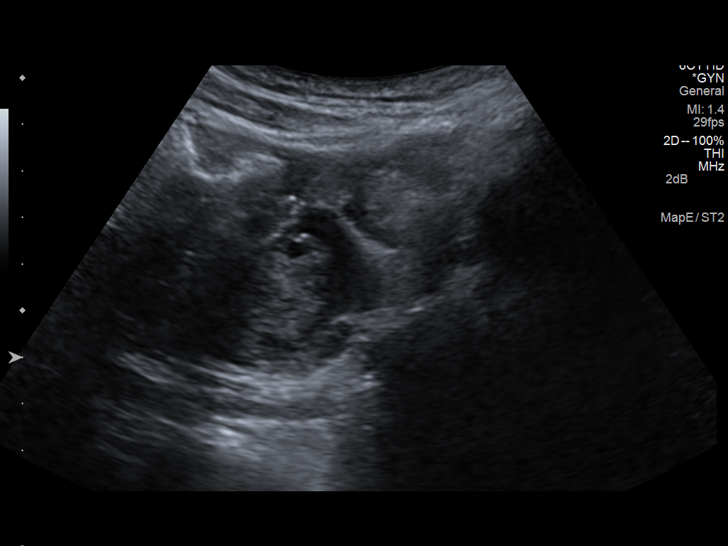
[im 17/50]
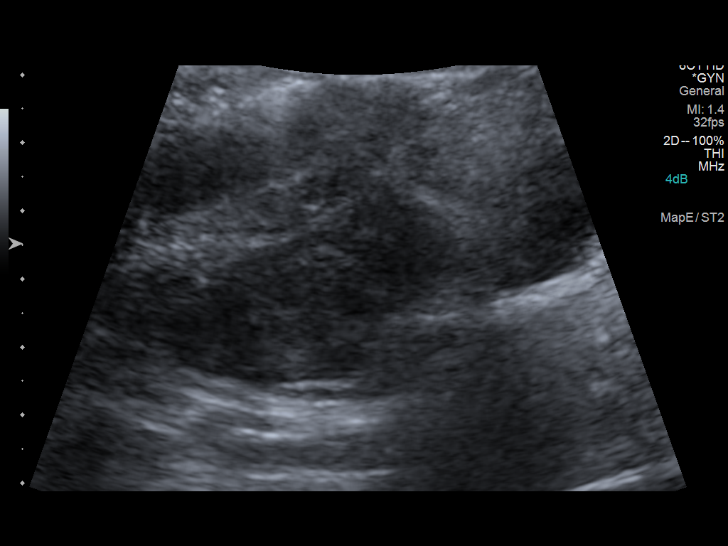
[im 21/50]
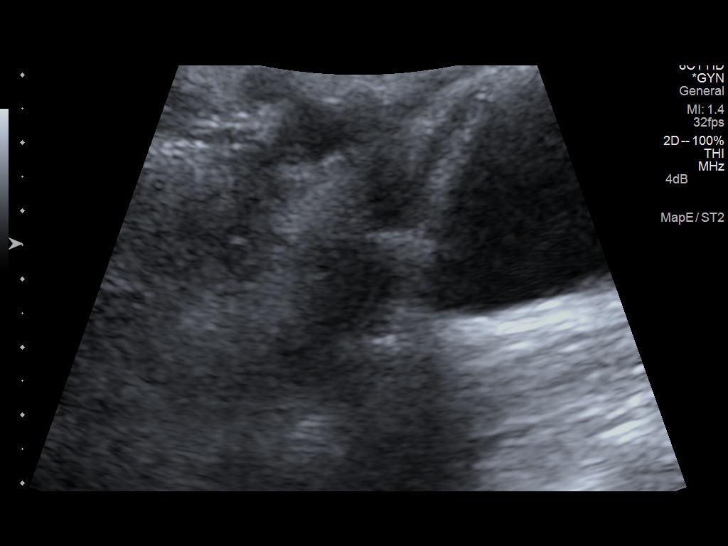
[im 25/50]
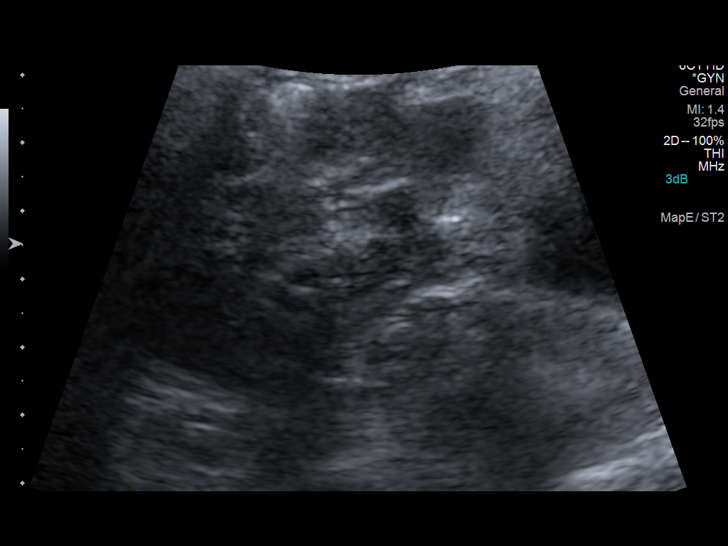
[im 29/50]
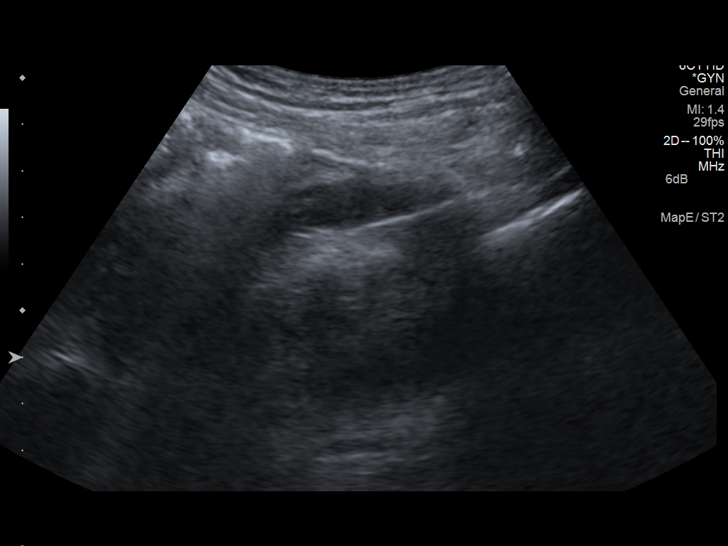
[im 33/50]
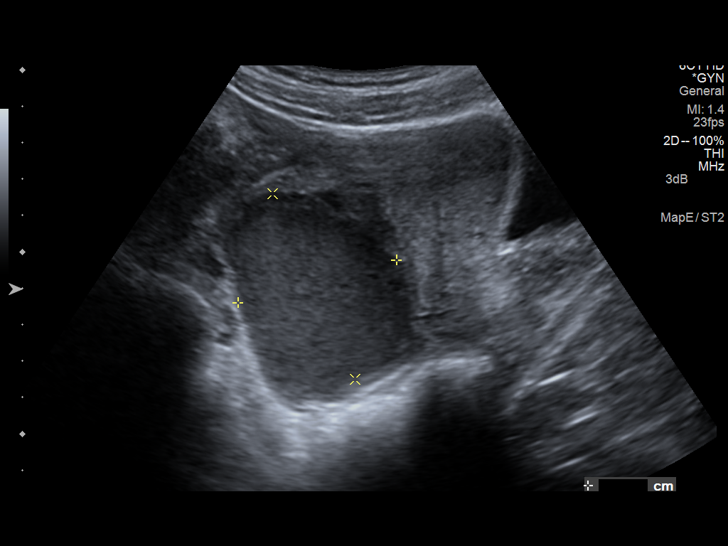
[im 37/50]
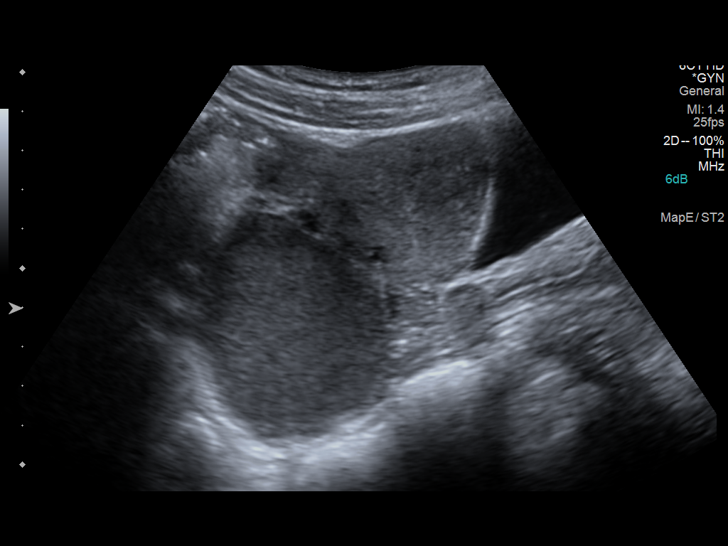
[im 41/50]
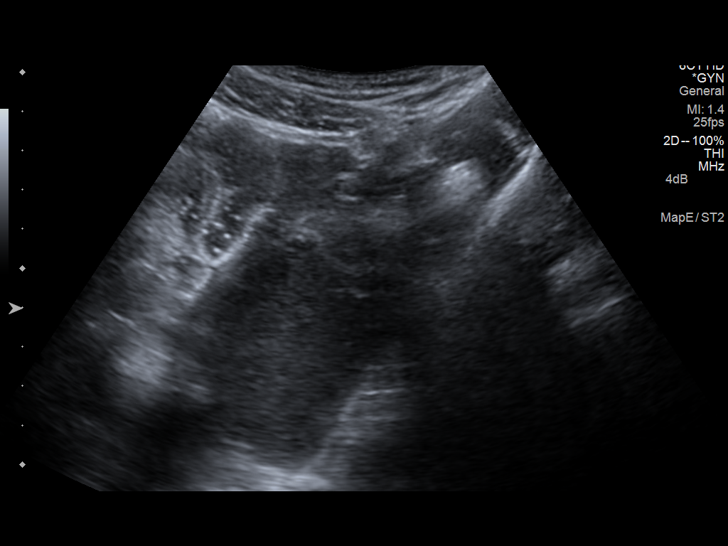
[im 45/50]
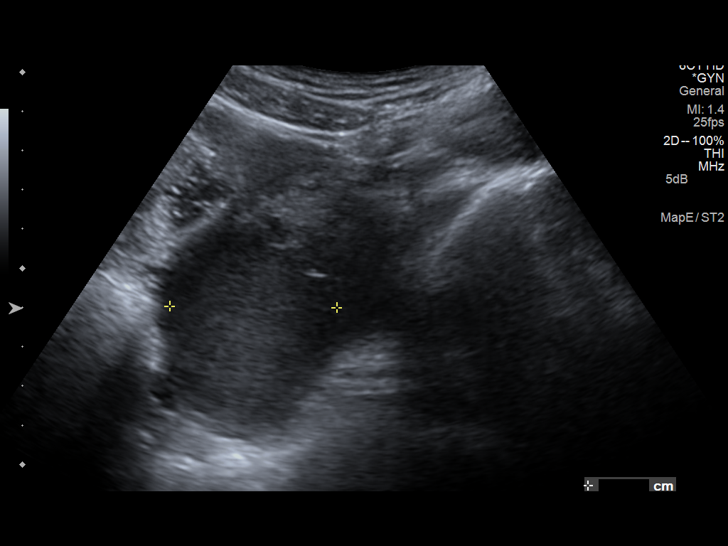
[im 50/50]
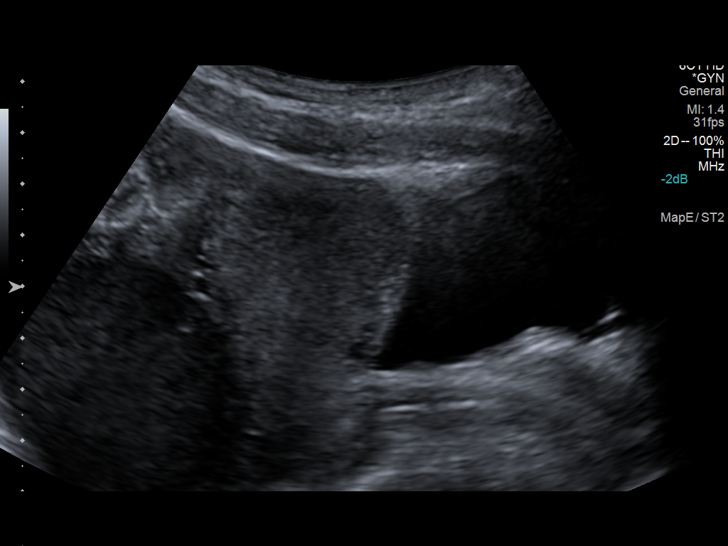

[13 of 25 positions shown; findings below may reference images not displayed]

FINDINGS: Transvaginal imaging not performed due to patient request.

Uterus

Measurements: 6 x 4 x 5 cm. No fibroids or other mass visualized.

Endometrium

Thickness: 4 mm.  No focal abnormality visualized.

Right ovary

Measurements: 4.6 x 2.1 x 2.7 cm. Normal appearance/no adnexal mass.

Left ovary

Measurements: 7.3 x 5.2 x 6.3 cm. The size asymmetry is related to a
4.5 x 5.6 x 4.3 cm nonvascular intra-ovarian mass which has diffuse
mid level echoes. No echogenic foci seen within the wall. No
posterior acoustic shadowing. No septation or nodularity. The
surrounding ovarian parenchyma does not appear edematous.

Pulsed Doppler evaluation demonstrates venous waveforms present
bilaterally. Arterial waveforms are fairly symmetric and low
resistance.

Small volume free pelvic fluid.
IMPRESSION: 1. 6 cm left ovarian cyst which could be endometrioma or hemorrhagic
cyst. 6-12 week follow-up sonogram recommended.
2. Preserved ovarian blood flow.

## 2016-01-28 ENCOUNTER — Other Ambulatory Visit: Payer: Self-pay | Admitting: Obstetrics and Gynecology

## 2016-01-28 DIAGNOSIS — D242 Benign neoplasm of left breast: Secondary | ICD-10-CM

## 2016-03-06 ENCOUNTER — Other Ambulatory Visit: Payer: 59

## 2016-03-13 ENCOUNTER — Other Ambulatory Visit: Payer: 59

## 2016-06-09 ENCOUNTER — Other Ambulatory Visit: Payer: Self-pay | Admitting: Obstetrics and Gynecology

## 2016-06-09 ENCOUNTER — Ambulatory Visit
Admission: RE | Admit: 2016-06-09 | Discharge: 2016-06-09 | Disposition: A | Discharge status: Home / Self Care | Payer: 59 | Source: Ambulatory Visit | Attending: Obstetrics and Gynecology | Admitting: Obstetrics and Gynecology

## 2016-06-09 DIAGNOSIS — D242 Benign neoplasm of left breast: Secondary | ICD-10-CM

## 2016-06-09 DIAGNOSIS — N6310 Unspecified lump in the right breast, unspecified quadrant: Secondary | ICD-10-CM

## 2017-04-25 ENCOUNTER — Other Ambulatory Visit: Payer: Self-pay | Admitting: Obstetrics and Gynecology

## 2017-04-25 DIAGNOSIS — N63 Unspecified lump in unspecified breast: Secondary | ICD-10-CM

## 2017-05-03 ENCOUNTER — Ambulatory Visit
Admission: RE | Admit: 2017-05-03 | Discharge: 2017-05-03 | Disposition: A | Discharge status: Home / Self Care | Payer: 59 | Source: Ambulatory Visit | Attending: Obstetrics and Gynecology | Admitting: Obstetrics and Gynecology

## 2017-05-03 DIAGNOSIS — N63 Unspecified lump in unspecified breast: Secondary | ICD-10-CM
